# Patient Record
Sex: Male | Born: 1965 | Race: Black or African American | Hispanic: No | Marital: Married
Health system: Southern US, Community
[De-identification: ages and names within clinical notes are randomized; demographics above are authoritative.]

---

## 2001-09-01 ENCOUNTER — Encounter: Payer: Self-pay | Admitting: Emergency Medicine

## 2001-09-01 ENCOUNTER — Emergency Department (HOSPITAL_COMMUNITY): Admission: EM | Admit: 2001-09-01 | Discharge: 2001-09-02 | Payer: Self-pay | Admitting: Emergency Medicine

## 2018-01-13 ENCOUNTER — Emergency Department (HOSPITAL_COMMUNITY)
Admission: EM | Admit: 2018-01-13 | Discharge: 2018-01-13 | Disposition: A | Discharge status: Home / Self Care | Payer: Self-pay | Attending: Emergency Medicine | Admitting: Emergency Medicine

## 2018-01-13 ENCOUNTER — Emergency Department (HOSPITAL_COMMUNITY): Payer: Self-pay

## 2018-01-13 ENCOUNTER — Other Ambulatory Visit: Payer: Self-pay

## 2018-01-13 DIAGNOSIS — S76212A Strain of adductor muscle, fascia and tendon of left thigh, initial encounter: Secondary | ICD-10-CM

## 2018-01-13 DIAGNOSIS — Y9389 Activity, other specified: Secondary | ICD-10-CM | POA: Insufficient documentation

## 2018-01-13 DIAGNOSIS — Y998 Other external cause status: Secondary | ICD-10-CM | POA: Insufficient documentation

## 2018-01-13 DIAGNOSIS — Y9289 Other specified places as the place of occurrence of the external cause: Secondary | ICD-10-CM | POA: Insufficient documentation

## 2018-01-13 DIAGNOSIS — S92001D Unspecified fracture of right calcaneus, subsequent encounter for fracture with routine healing: Secondary | ICD-10-CM

## 2018-01-13 MED ORDER — OXYCODONE-ACETAMINOPHEN 5-325 MG PO TABS: 2.0000 | ORAL_TABLET | ORAL | 0 refills | Status: AC | PRN

## 2018-01-13 MED ORDER — OXYCODONE-ACETAMINOPHEN 5-325 MG PO TABS
2.0000 | ORAL_TABLET | Freq: Once | ORAL | Status: AC
  Administered 2018-01-13: 2 via ORAL
  Filled 2018-01-13: qty 2
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
## 2018-01-13 NOTE — ED Notes (Signed)
Patient transported to X-ray 
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
## 2018-01-13 NOTE — ED Notes (Addendum)
Upon xray tech assisting pt to wheel chair pts left groin cramped . Pt slid down chair. Per xray: Pt did not have a fall pts leg just cramped and pt slid down the bed. When this RN went into room pts feet were on floor and pts body was on chair. Pt reports that his left groin was still cramping. Pt assisted back up into chair. Xray will return shortly. Will continue to monitor pt. Pt denies any injury. Pt states " My groin just cramped up" PA at bedside

## 2018-01-13 NOTE — ED Notes (Signed)
Pt requesting water again. Pt informed again. We will need to wait until Xray results come back. Per PA: Pt remains NPO.

## 2018-01-13 NOTE — ED Notes (Signed)
Pt requesting water. Pt and family informed. He may not have water at this time.

## 2018-01-13 NOTE — Discharge Instructions (Addendum)
You need to elevate as much as possible

## 2019-11-12 IMAGING — DX DG ANKLE COMPLETE 3+V*R*
3 series · 3 of 3 positions shown · non-contrast
Comparison: None.

CLINICAL DATA: Injury with right ankle pain

EXAM:
RIGHT ANKLE - COMPLETE 3+ VIEW

[ankle ap]
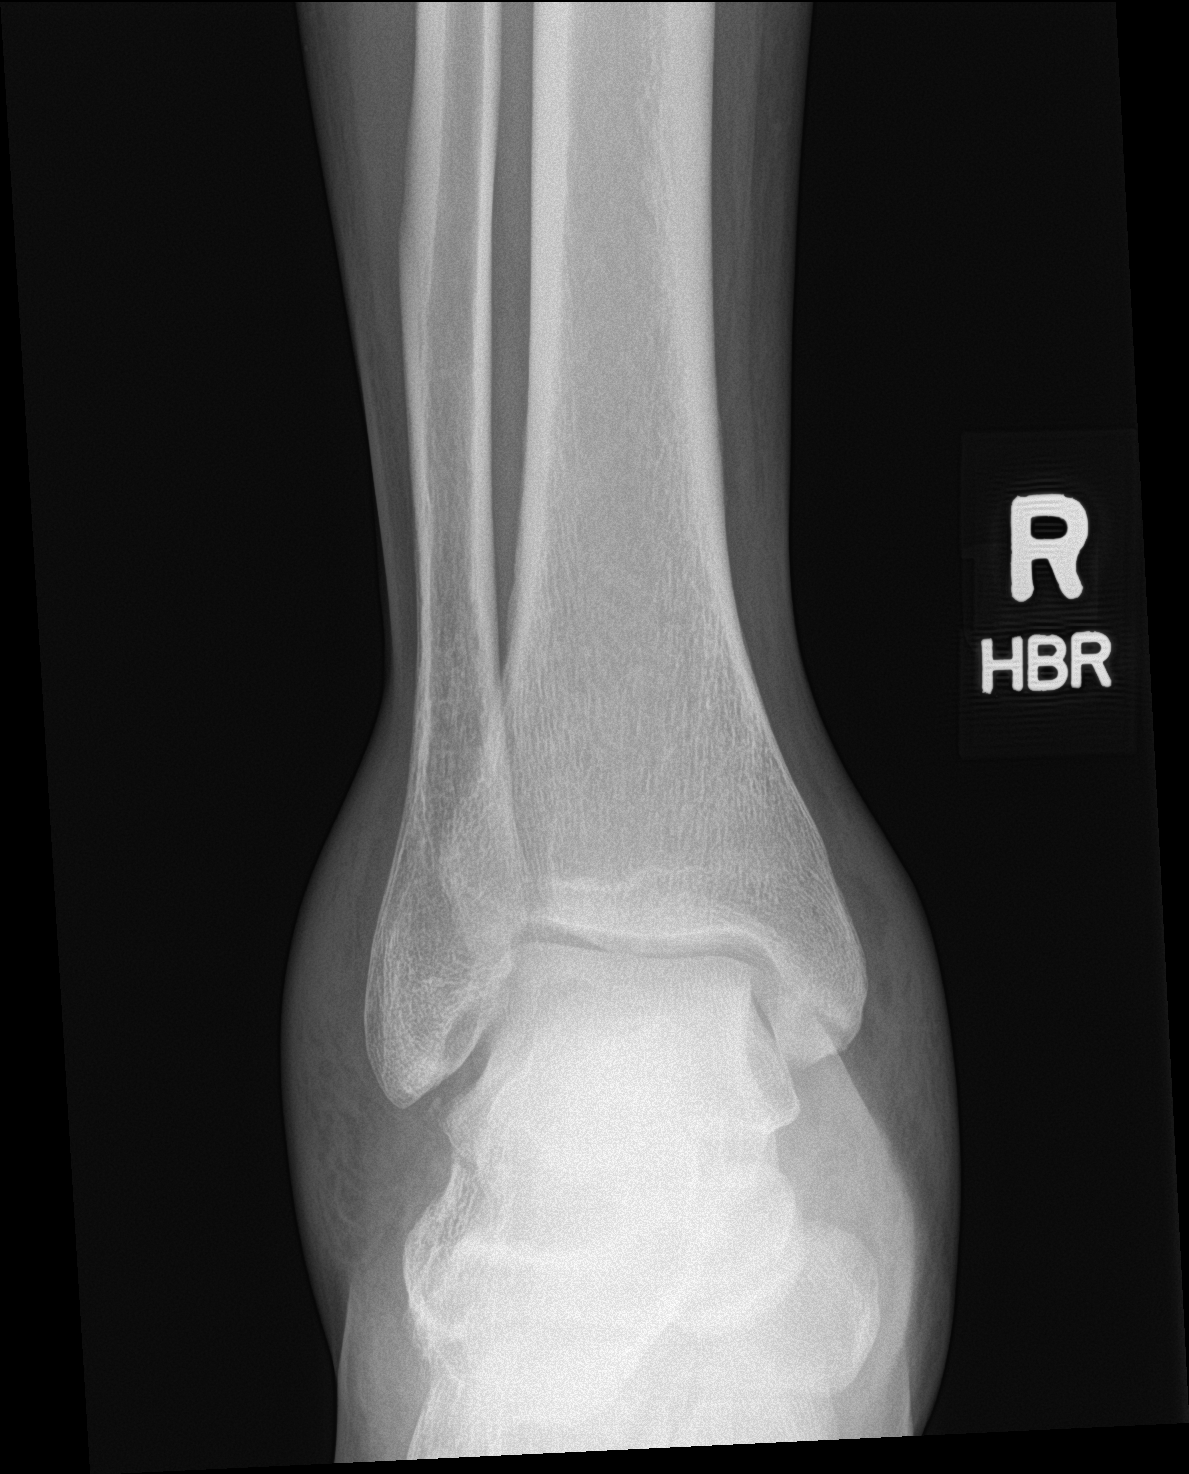

[ankle obl]
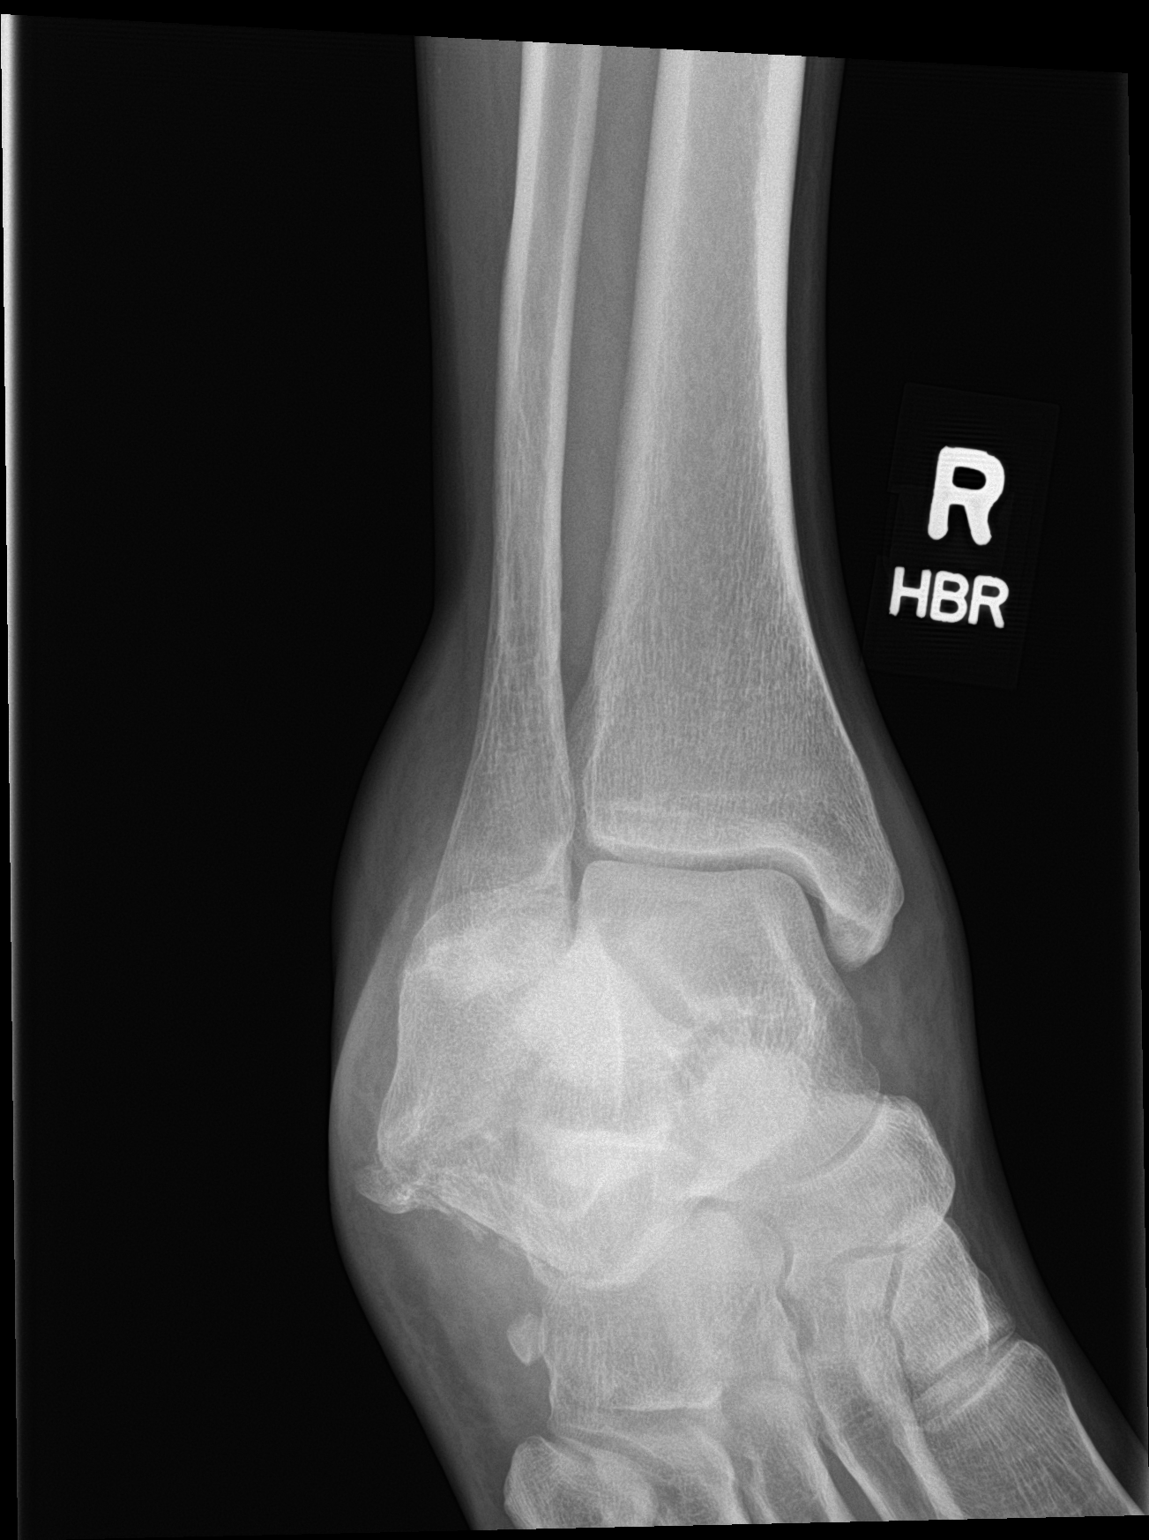

[ankle lat]
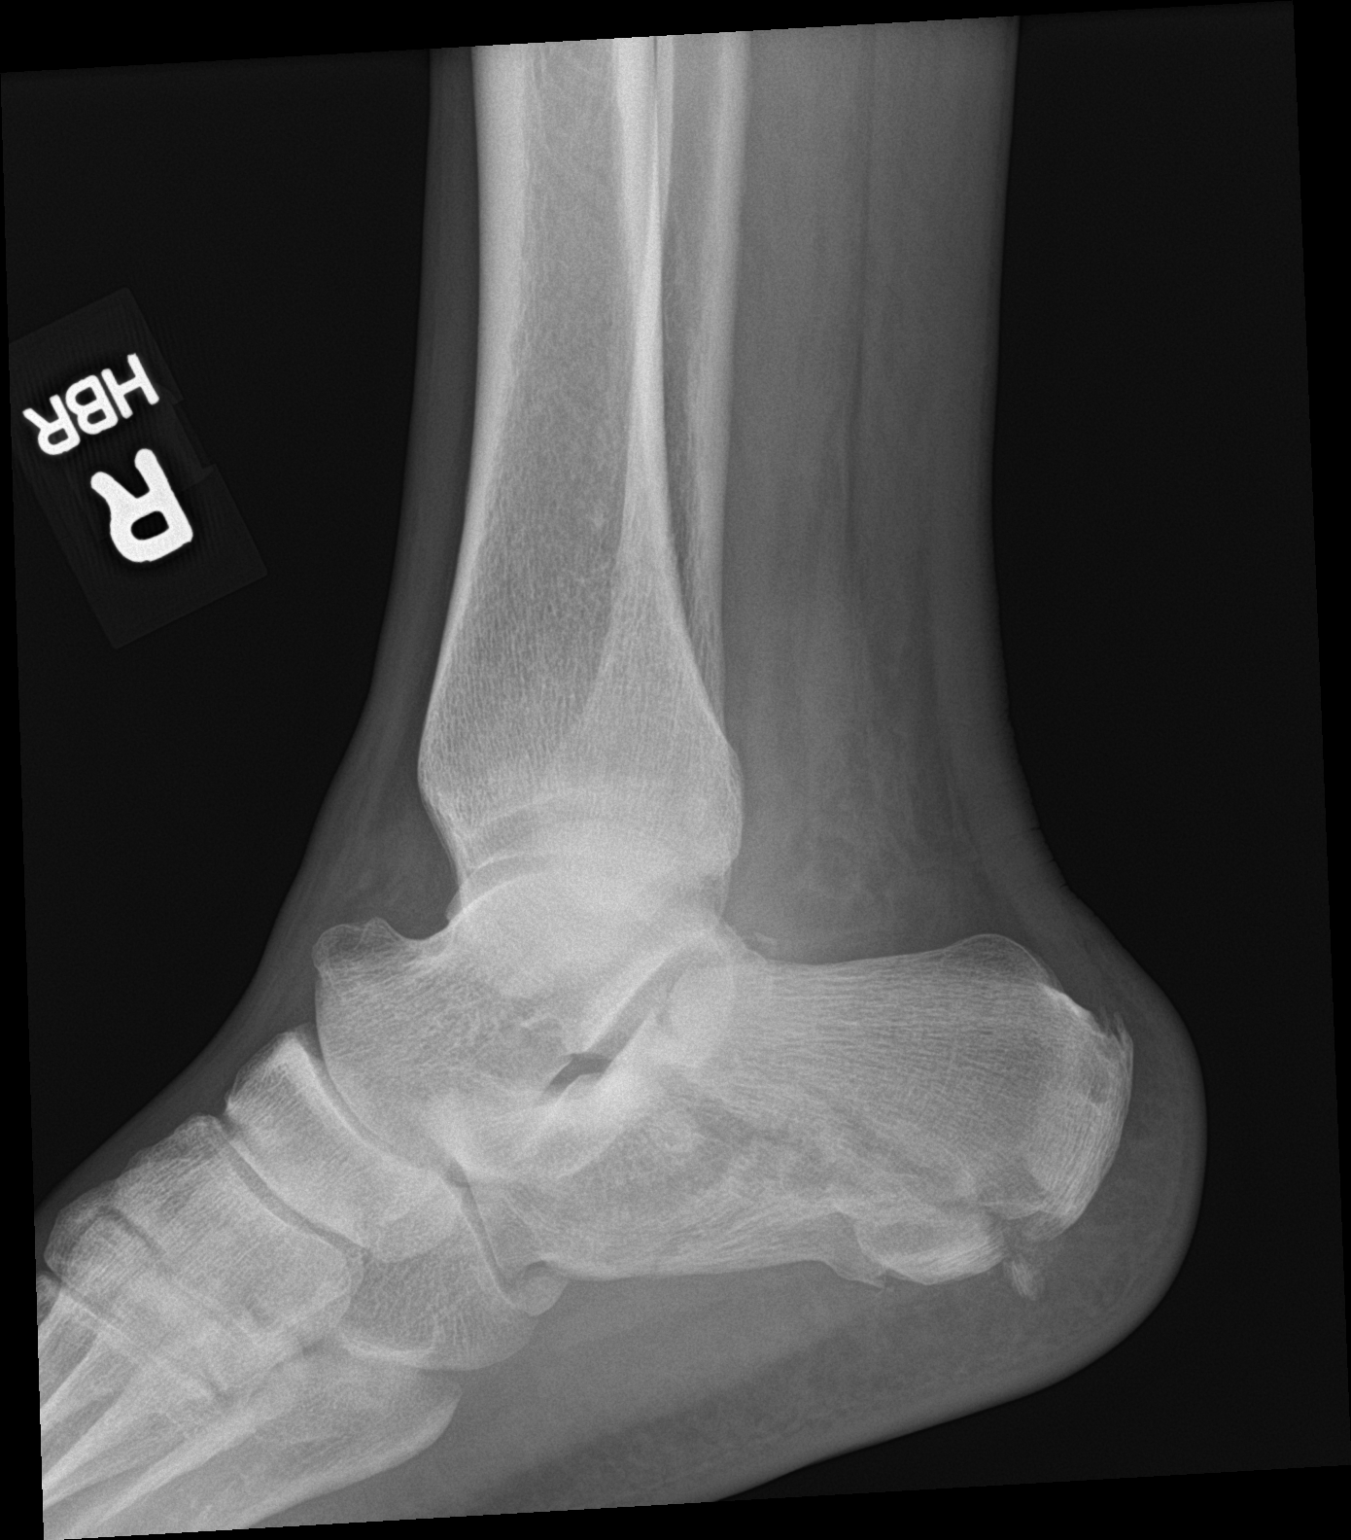

[3 of 3 positions shown; findings below may reference images not displayed]

FINDINGS: Significant soft tissue swelling. Acute comminuted and slightly
impacted calcaneal fracture with extension to the subtalar joint.
Small cortical avulsion fractures along the posterior and lateral
aspect of the talus. Ankle mortise is symmetric.
IMPRESSION: 1. Acute comminuted and impacted calcaneus fracture with extension
to the subtalar articulation
2. Small cortical fractures involving the posterior and
inferolateral talus.
